# Patient Record
Sex: Female | Born: 1970 | Hispanic: Yes | Marital: Married | Smoking: Never smoker
Health system: Southern US, Community
[De-identification: ages and names within clinical notes are randomized; demographics above are authoritative.]

## PROBLEM LIST (undated history)

## (undated) HISTORY — PX: FRACTURE SURGERY: SHX138

---

## 2014-04-26 ENCOUNTER — Other Ambulatory Visit: Payer: Self-pay | Admitting: *Deleted

## 2014-04-26 DIAGNOSIS — N83209 Unspecified ovarian cyst, unspecified side: Secondary | ICD-10-CM

## 2014-04-27 ENCOUNTER — Telehealth: Payer: Self-pay | Admitting: *Deleted

## 2014-04-27 NOTE — Telephone Encounter (Signed)
Attempted to contact patient, no answer, left message for patient that she was referred to office and we scheduled her an ultrasound appointment for Monday, 9/28 @1245 .  Requested pt to call clinic to confirm she received the message.

## 2014-04-27 NOTE — Telephone Encounter (Signed)
Pt called the clinic to verify appointment.

## 2014-05-15 ENCOUNTER — Ambulatory Visit (HOSPITAL_COMMUNITY)
Admission: RE | Admit: 2014-05-15 | Discharge: 2014-05-15 | Disposition: A | Discharge status: Home / Self Care | Payer: Self-pay | Source: Ambulatory Visit | Attending: Obstetrics & Gynecology | Admitting: Obstetrics & Gynecology

## 2014-05-15 DIAGNOSIS — D259 Leiomyoma of uterus, unspecified: Secondary | ICD-10-CM | POA: Insufficient documentation

## 2014-05-15 DIAGNOSIS — Z30431 Encounter for routine checking of intrauterine contraceptive device: Secondary | ICD-10-CM | POA: Insufficient documentation

## 2014-05-15 DIAGNOSIS — N938 Other specified abnormal uterine and vaginal bleeding: Secondary | ICD-10-CM | POA: Insufficient documentation

## 2014-05-15 DIAGNOSIS — N83209 Unspecified ovarian cyst, unspecified side: Secondary | ICD-10-CM | POA: Insufficient documentation

## 2014-05-15 DIAGNOSIS — N949 Unspecified condition associated with female genital organs and menstrual cycle: Secondary | ICD-10-CM | POA: Insufficient documentation

## 2014-05-17 ENCOUNTER — Telehealth: Payer: Self-pay | Admitting: *Deleted

## 2014-05-17 NOTE — Telephone Encounter (Signed)
Pt returned our call and I spoke to her with interpreter Kimberlee Nearing. I informed her of [place] results as requested by Dr. Harolyn Rutherford and stated that she may have mild pain occasionally. I instructed her to come to hospital immediately if she develops severe abdominal/pelvic pain. I also advised her that she will need a follow up appt with the doctor to discuss plan for further management. She will receive a call within the next few days with her appt information.  Pt voiced understanding of all information and instructions given.

## 2014-05-17 NOTE — Telephone Encounter (Signed)
Called Dawn Hart with Interpreter Kimberlee Nearing and left message we are calling with some information, please call clinic.

## 2014-05-17 NOTE — Telephone Encounter (Signed)
Message copied by Samuel Germany on Wed May 17, 2014 11:05 AM ------      Message from: Verita Schneiders A      Created: Mon May 15, 2014  3:16 PM       Small fibroids and two left ovarian cysts.  IUD is in correct position.  No need for acute surgery, but give torsion precautions given size of left ovary.  Patient needs appointment to discuss results and further management.  Please call to inform patient of results and recommendations. ------

## 2014-06-19 ENCOUNTER — Ambulatory Visit (INDEPENDENT_AMBULATORY_CARE_PROVIDER_SITE_OTHER): Payer: Self-pay | Admitting: Obstetrics and Gynecology

## 2014-06-19 ENCOUNTER — Encounter: Payer: Self-pay | Admitting: Obstetrics and Gynecology

## 2014-06-19 VITALS — BP 109/50 | HR 64 | Temp 98.1°F | Wt 152.0 lb

## 2014-06-19 DIAGNOSIS — N8320 Unspecified ovarian cysts: Secondary | ICD-10-CM

## 2014-06-19 DIAGNOSIS — N83202 Unspecified ovarian cyst, left side: Secondary | ICD-10-CM

## 2014-06-19 NOTE — Progress Notes (Signed)
Lyla Son used as interpreter for this encounter.

## 2014-06-19 NOTE — Progress Notes (Signed)
Patient ID: Dawn Hart, female   DOB: [birth year], 43 y.o.   MRN: [id number] 43 yo G4P4 with LMP 06/12/2014 presenting today for the evaluation of an ovarian cyst. Patient reports being evaluated for an ovarian cyst since March 2015. She states she also has fibroids. Patient is here to discuss results of her ultrasound. Patient also reports a monthly period lasting 6 days with 4 days being very heavy. She describes the presence of a pelvic pain that seems to be worst in the evening. She describes it as menstrual cramps which are relieved by ibuprofen and tylenol  History reviewed. No pertinent past medical history. Past Surgical History  Procedure Laterality Date  . Fracture surgery      right elbow   Family History  Problem Relation Age of Onset  . Hypertension Mother   . Heart disease Mother    History  Substance Use Topics  . Smoking status: Never Smoker   . Smokeless tobacco: Never Used  . Alcohol Use: No   GENERAL: Well-developed, well-nourished female in no acute distress.  ABDOMEN: Soft, nontender, nondistended. No organomegaly. PELVIC: Normal external female genitalia. Uterus is normal in size. No adnexal mass or tenderness. EXTREMITIES: No cyanosis, clubbing, or edema, 2+ distal pulses.  05/15/2014 Ultrasound IMPRESSION: 1. Small uterine fibroids. 2. Intrauterine device in expected location. 3. Endometrial thickness is difficult to measure but appears normal. If bleeding remains unresponsive to hormonal or medical therapy, sonohysterogram should be considered for focal lesion work-up. (Ref: Radiological Reasoning: Algorithmic Workup of Abnormal Vaginal Bleeding with Endovaginal Sonography and Sonohysterography. AJR 2008; 709:U43-83) 4. Complex appearance of the left ovary. Neither mass demonstrates suspicious features but follow-up is warranted. Correlation with the prior study is recommended. Consider follow-up in 8-12 weeks if appearance is stable or  smaller compared with prior study.  A/P 43 yo here for the evaluation of an ovarian cyst - Ultrasound results reviewed and explained to the patient.  - Reassurance provided regarding size of fibroids - Discussed need to repeat imaging in early December to follow up on ovarian cyst. Patient states that she has had an ultrasound in March- will obtain records to review - Discussed changing IUD to [place] IUD as it is due to expire this year and it will decrease the flow of her cycles

## 2014-09-13 ENCOUNTER — Telehealth: Payer: Self-pay | Admitting: *Deleted

## 2014-09-13 DIAGNOSIS — N83202 Unspecified ovarian cyst, left side: Secondary | ICD-10-CM | POA: Insufficient documentation

## 2014-09-13 NOTE — Telephone Encounter (Addendum)
-----   Message from Mora Bellman, MD sent at 09/12/2014  9:03 AM EST ----- Please schedule follow up ultrasound for left ovarian cyst. Patient had her last ultrasound on 04/2014 and needed follow up in December/January.   Thanks  [employer] pt w/ [employer] [phone].  Message left that I am calling to schedule another [place] (order has been placed).  Please call back so that we may schedule this appt.  Lexiana Spindel RNC

## 2014-09-14 NOTE — Telephone Encounter (Signed)
Called Dawn Hart with Interpreter [employer]ter. Left message we are calling and need to talk to you, please call clinic before 4pm.

## 2014-09-20 NOTE — Telephone Encounter (Signed)
Called pt with interpreter Dawn Hart and informed her of need for repeat ultrasound to assess ovarian cyst. Pt stated that she had received our calls but had not called back because she is still working on her insurance. She has appt to settle that today.  She agreed to appt on 2/10 @ 1400.  Pt was informed she will need to have a full bladder just like at her [place] last September. Pt voiced understanding of all information.

## 2014-09-27 ENCOUNTER — Ambulatory Visit (HOSPITAL_COMMUNITY)
Admission: RE | Admit: 2014-09-27 | Discharge: 2014-09-27 | Disposition: A | Discharge status: Home / Self Care | Payer: No Typology Code available for payment source | Source: Ambulatory Visit | Attending: Obstetrics and Gynecology | Admitting: Obstetrics and Gynecology

## 2014-09-27 DIAGNOSIS — N8320 Unspecified ovarian cysts: Secondary | ICD-10-CM | POA: Insufficient documentation

## 2014-09-27 DIAGNOSIS — D252 Subserosal leiomyoma of uterus: Secondary | ICD-10-CM | POA: Insufficient documentation

## 2014-09-27 DIAGNOSIS — N83202 Unspecified ovarian cyst, left side: Secondary | ICD-10-CM

## 2014-09-27 DIAGNOSIS — D250 Submucous leiomyoma of uterus: Secondary | ICD-10-CM | POA: Insufficient documentation

## 2014-09-27 DIAGNOSIS — N7011 Chronic salpingitis: Secondary | ICD-10-CM | POA: Insufficient documentation

## 2014-10-03 ENCOUNTER — Telehealth: Payer: Self-pay

## 2014-10-03 NOTE — Telephone Encounter (Signed)
-----   Message from Mora Bellman, MD sent at 09/29/2014 10:52 AM EST ----- Please inform patient of complete resolution of the left ovarian cyst  Thanks  Vickii Chafe

## 2014-10-03 NOTE — Telephone Encounter (Signed)
Called patient with pacific interpreter [phone]. No answer. Left message stating we are calling with results, please call clinic.

## 2014-10-05 NOTE — Telephone Encounter (Signed)
Called pt with Spanish interpreter Dawn Hart and LM to call the clinics it is concerning results.  Letter sent.

## 2015-04-10 IMAGING — US US TRANSVAGINAL NON-OB
1 series · 13 of 25 positions shown · non-contrast
Comparison: None

CLINICAL DATA: Previous cyst of the left ovary. Pelvic pain and
bleeding over the last 8 months. Intrauterine device. 05/08/2014
gravida 4 para 4.



[Series 1: us transvaginal non-ob · 0.20mm/px · 13 of 72 slices shown]
[im 1/72]
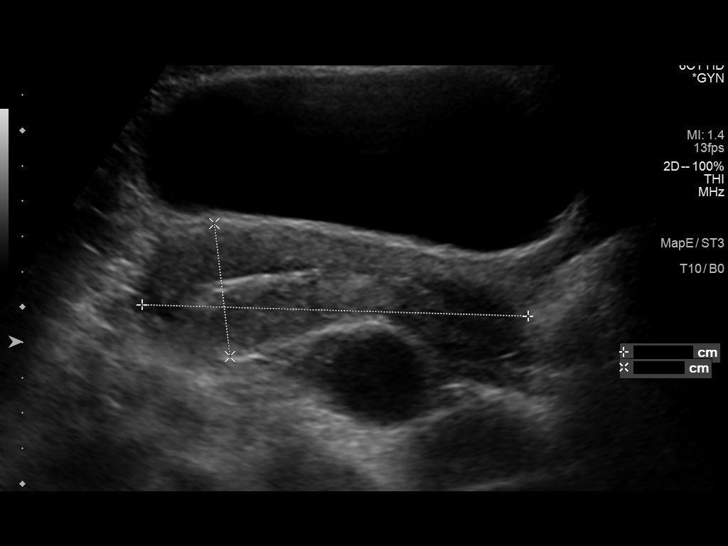
[im 6/72]
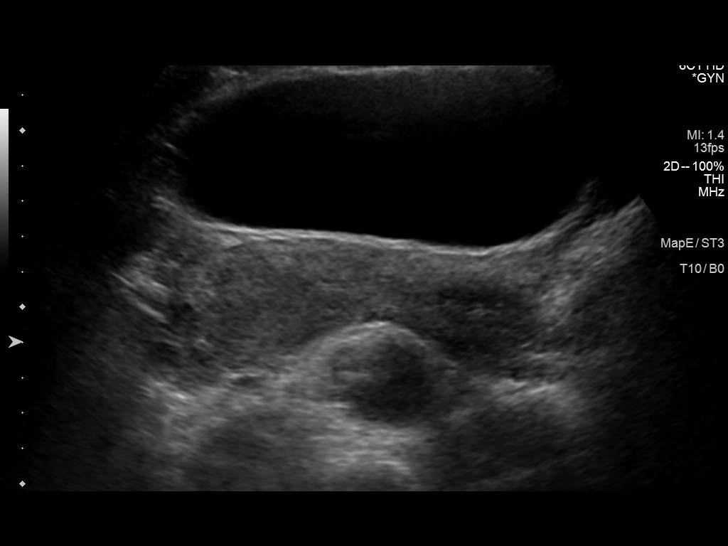
[im 12/72]
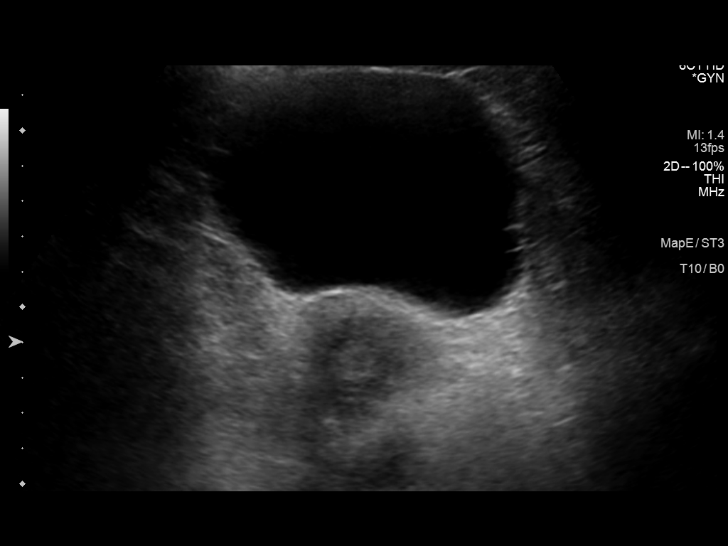
[im 18/72]
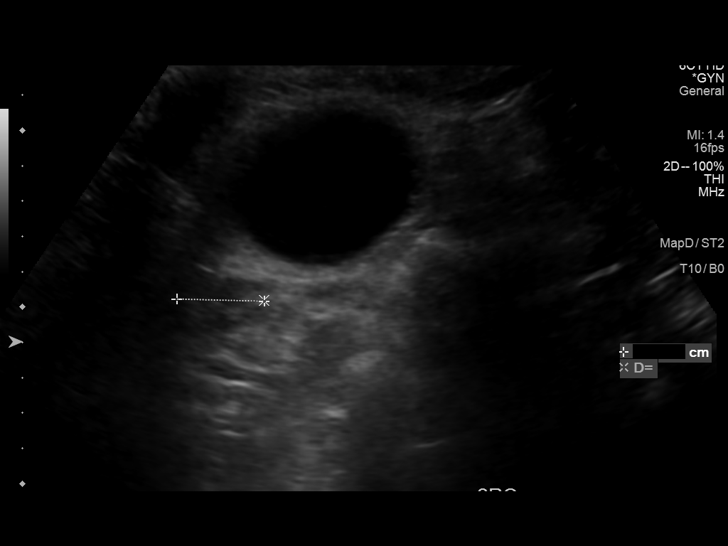
[im 24/72]
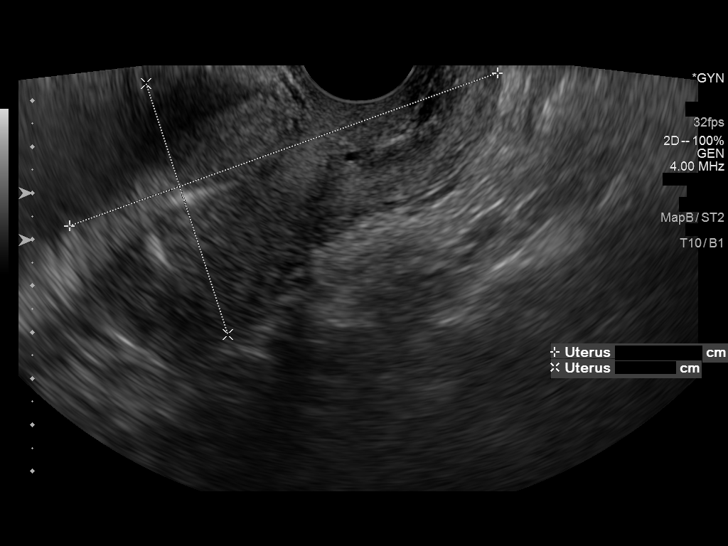
[im 30/72]
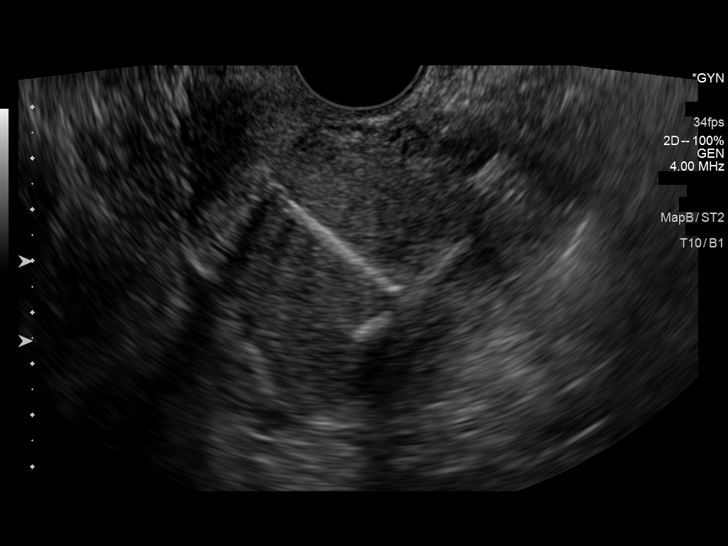
[im 36/72]
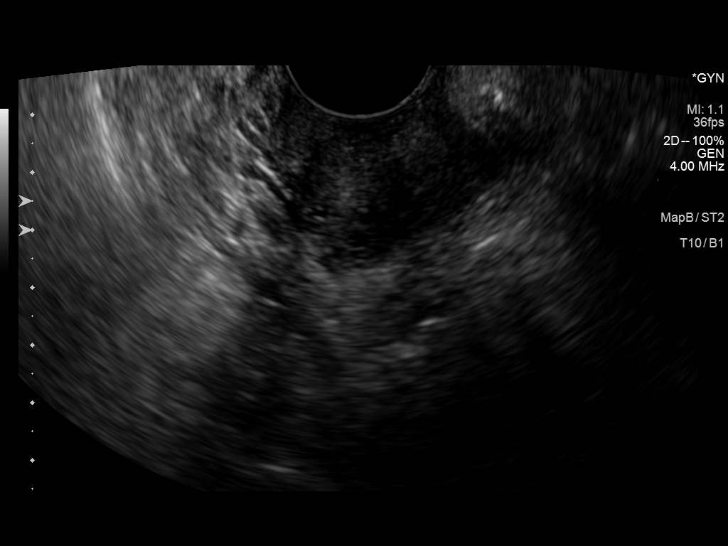
[im 42/72]
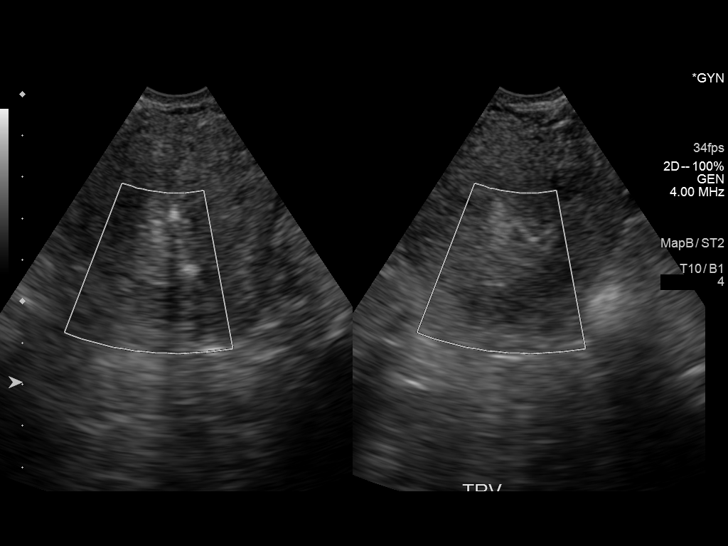
[im 48/72]
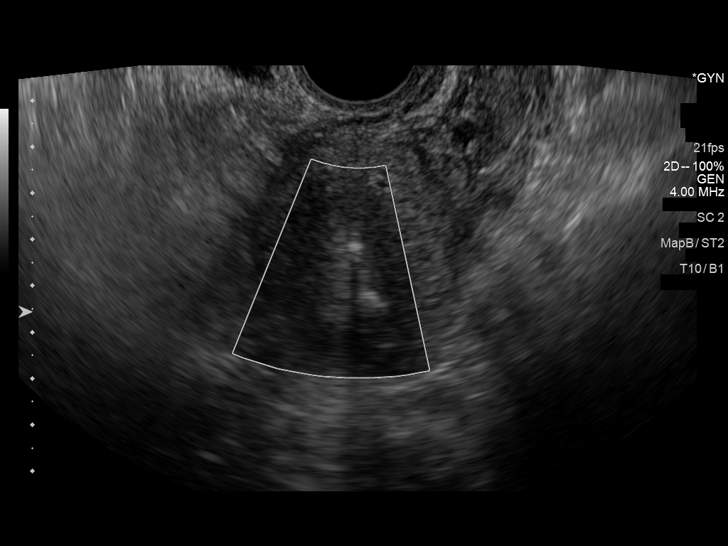
[im 54/72]
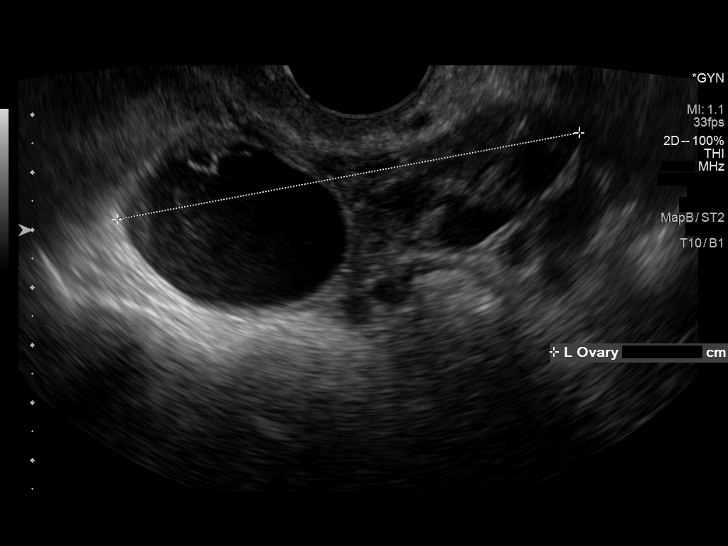
[im 60/72]
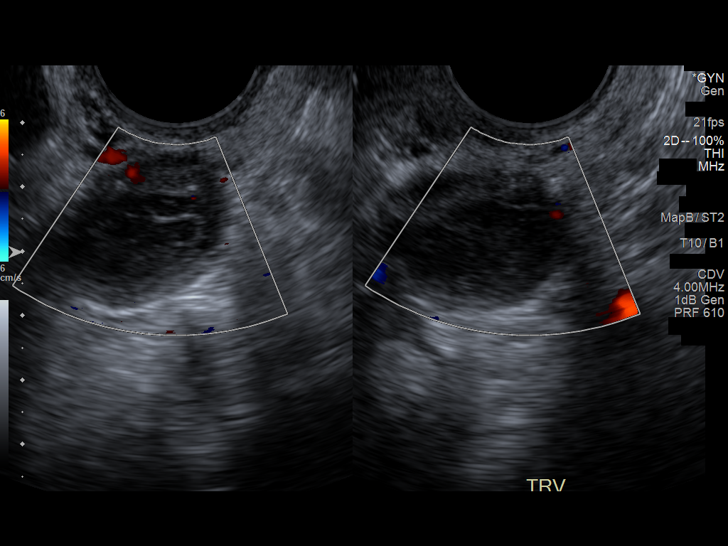
[im 66/72]
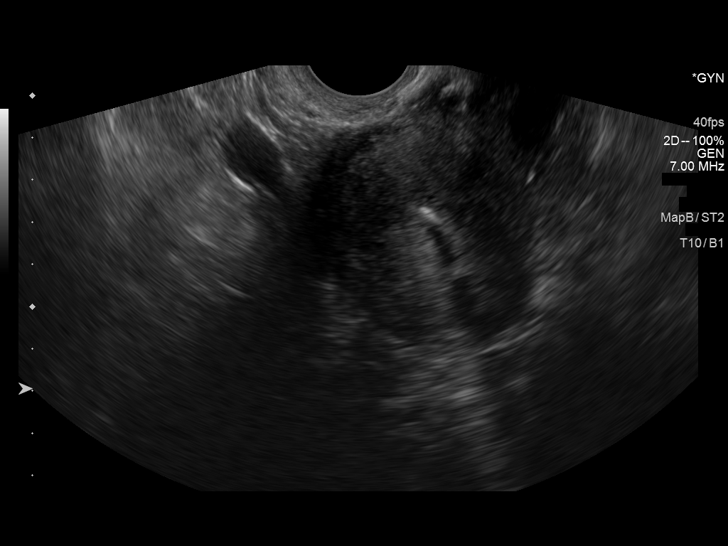
[im 72/72]
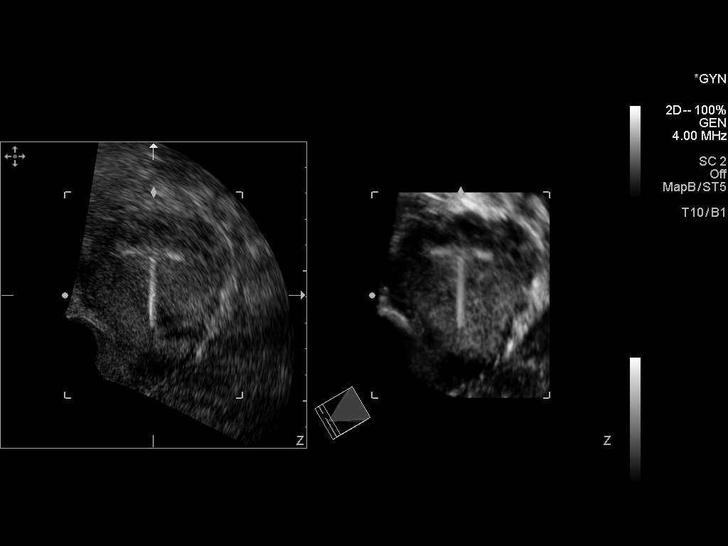

[13 of 25 positions shown; findings below may reference images not displayed]

FINDINGS: Uterus

Measurements: 10.9 x 3.8 x 5.4 cm. Small myometrial fibroids are
identified. Left mid uterine fibroid is 1.2 x 0.9 x 1.1 cm. Left
fundal fibroid is 0.8 x 0.6 x 0.8 cm. Right mid uterine fibroid is
0.9 x 0.8 x 0.9 cm.

Endometrium

Thickness: 1.2 cm ; somewhat difficult to measure given the position
of the uterus. Intrauterine device is identified in expected
location.

Right ovary

Measurements: 2.2 x 1.7 x 2.5 cm. Normal appearance/no adnexal mass.

Left ovary

Measurements: 6.4 x 3.2 x 8.2 cm. There are 2 discrete masses within
the left ovary. The first is more anechoic and contains thin
internal septations. Cyst measures 3.9 x 3.0 x 3.7 cm. The second
mass contains low-level reticular echoes and measures 2.5 x 1.7 x
2.7 cm. Neither mass demonstrates internal blood flow. There is
color Doppler flow within the ovary.

Other findings

No free fluid.
IMPRESSION: 1. Small uterine fibroids.
2. Intrauterine device in expected location.
3. Endometrial thickness is difficult to measure but appears normal.
If bleeding remains unresponsive to hormonal or medical therapy,
sonohysterogram should be considered for focal lesion work-up. (Ref:
Radiological Reasoning: Algorithmic Workup of Abnormal Vaginal
Bleeding with Endovaginal Sonography and Sonohysterography. AJR
5337; 191:S68-73)
4. Complex appearance of the left ovary. Neither mass demonstrates
suspicious features but follow-up is warranted. Correlation with the
prior study is recommended. Consider follow-up in 8-12 weeks if
appearance is stable or smaller compared with prior study.

## 2015-08-23 IMAGING — US US PELVIS COMPLETE
1 series · 14 of 25 positions shown · non-contrast
Comparison: None

CLINICAL DATA: Follow-up left ovarian cyst, known fibroids, Mirena
IUD

EXAM:
TRANSABDOMINAL AND TRANSVAGINAL ULTRASOUND OF PELVIS
TECHNIQUE: Both transabdominal and transvaginal ultrasound examinations of the
pelvis were performed. Transabdominal technique was performed for
global imaging of the pelvis including uterus, ovaries, adnexal
regions, and pelvic cul-de-sac. It was necessary to proceed with
endovaginal exam following the transabdominal exam to visualize the
endometrium and right ovary.

[Series 1: us pelvis complete · 14 of 57 slices shown]
[im 1/57]
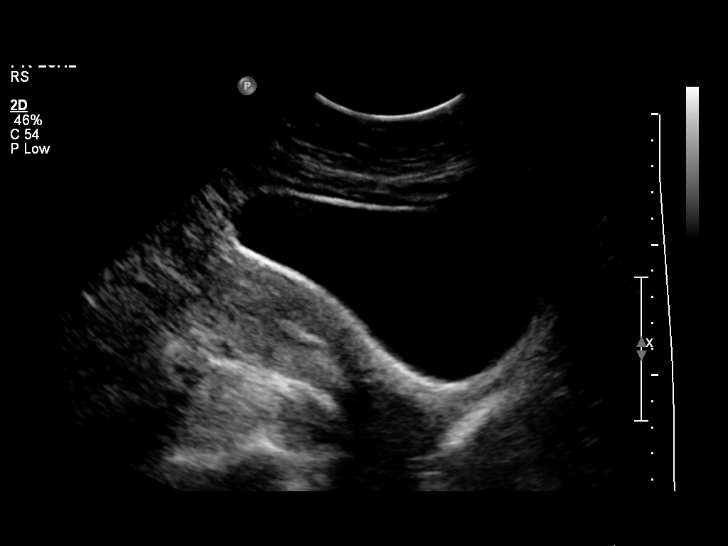
[im 5/57]
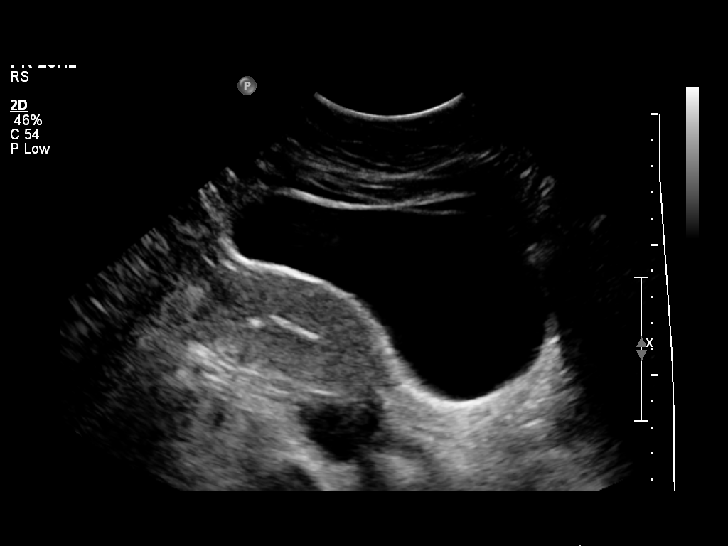
[im 10/57]
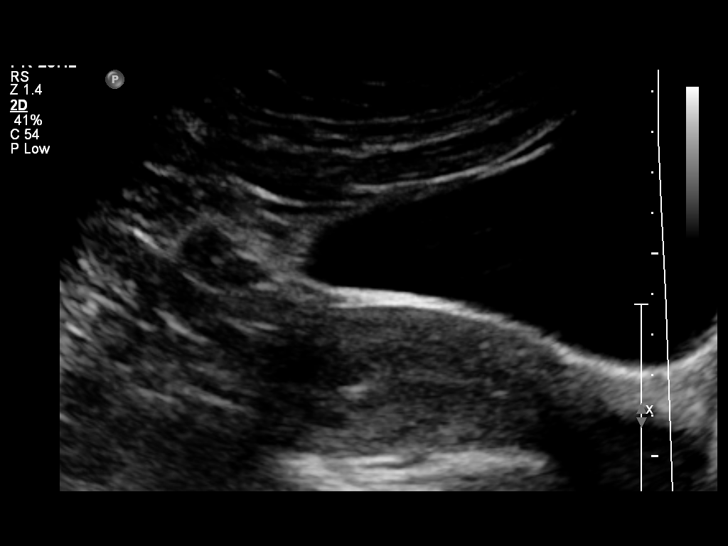
[im 15/57]
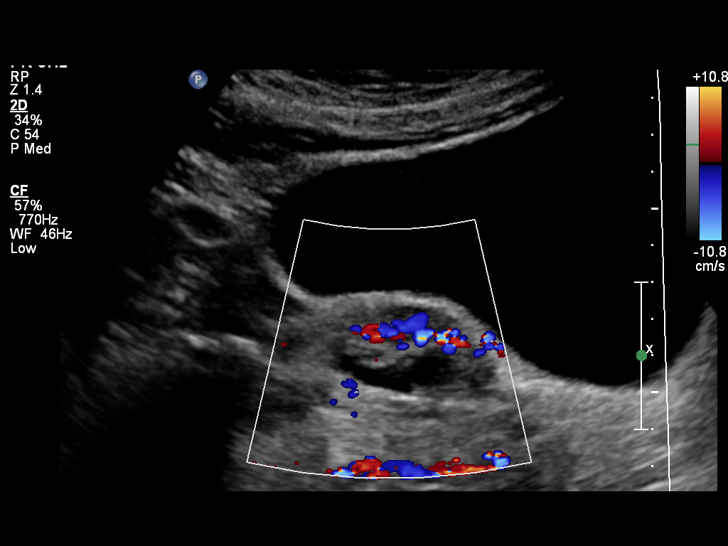
[im 19/57]
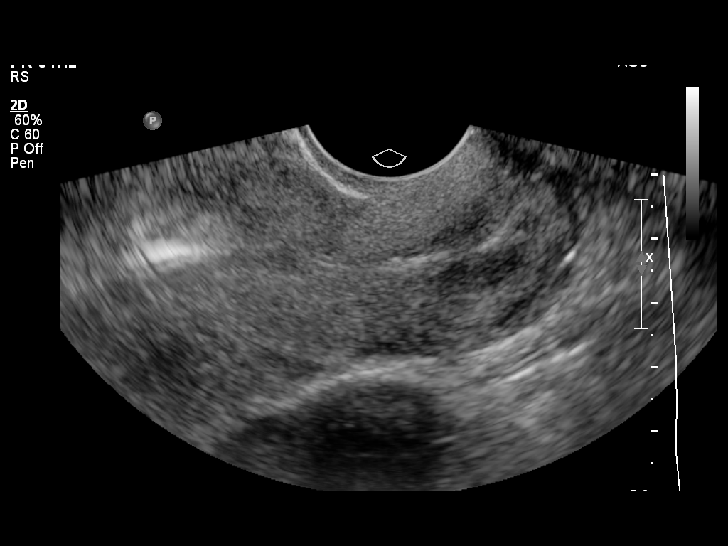
[im 22/57]
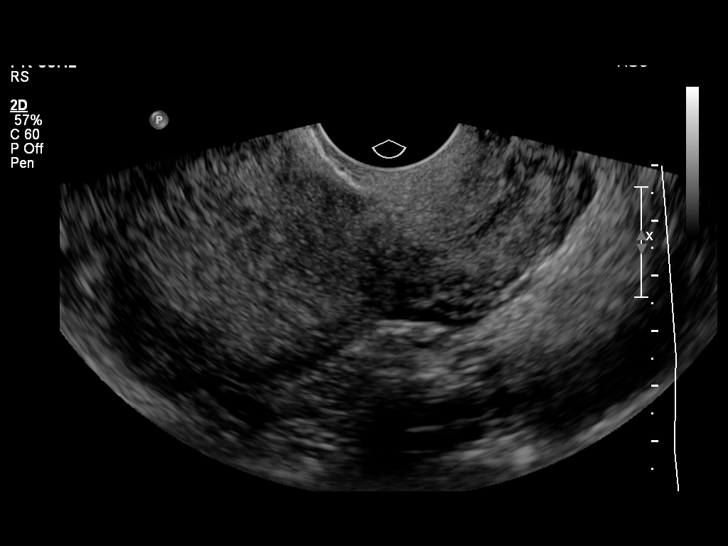
[im 26/57]
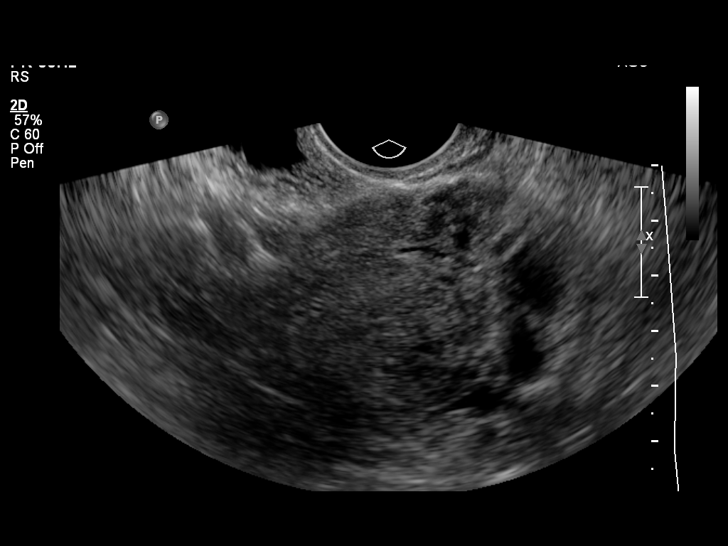
[im 31/57]
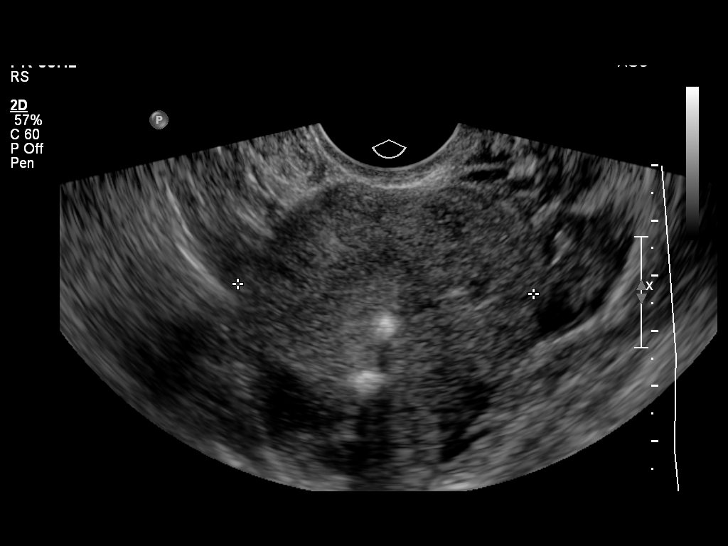
[im 36/57]
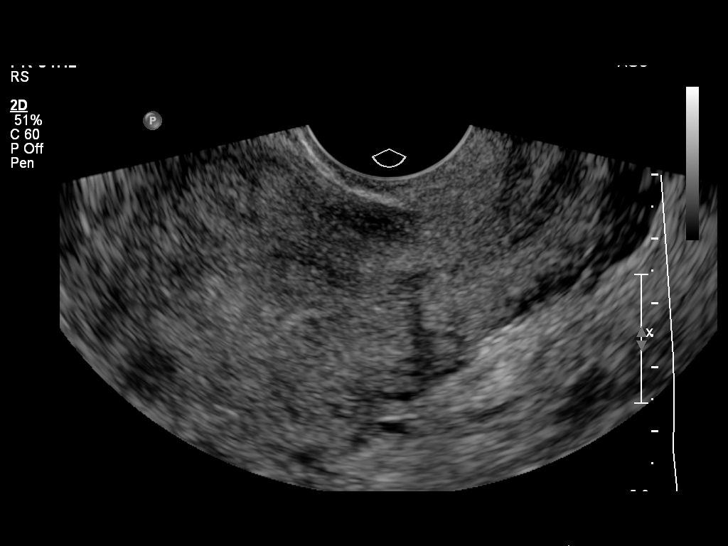
[im 38/57]
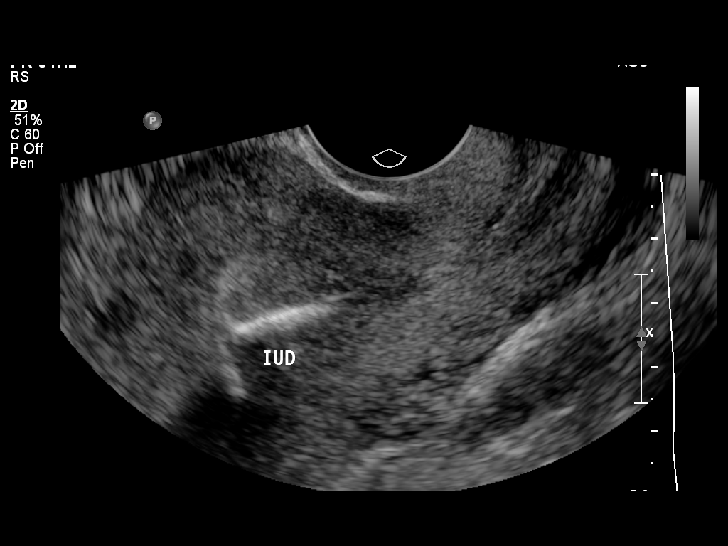
[im 43/57]
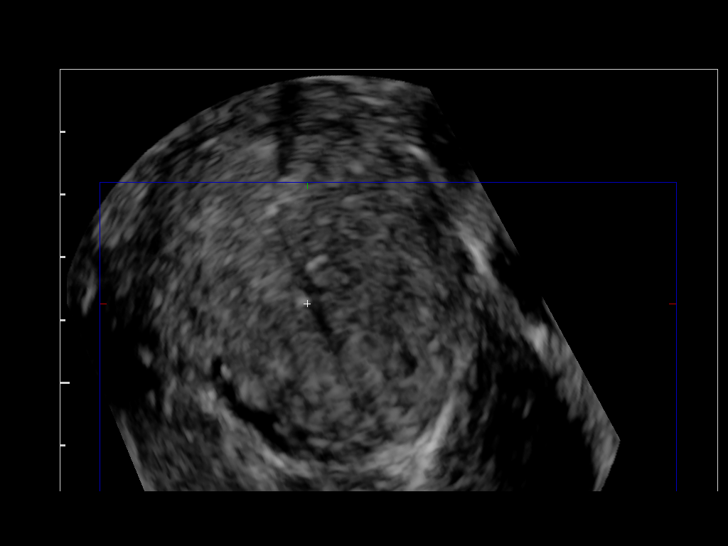
[im 47/57]
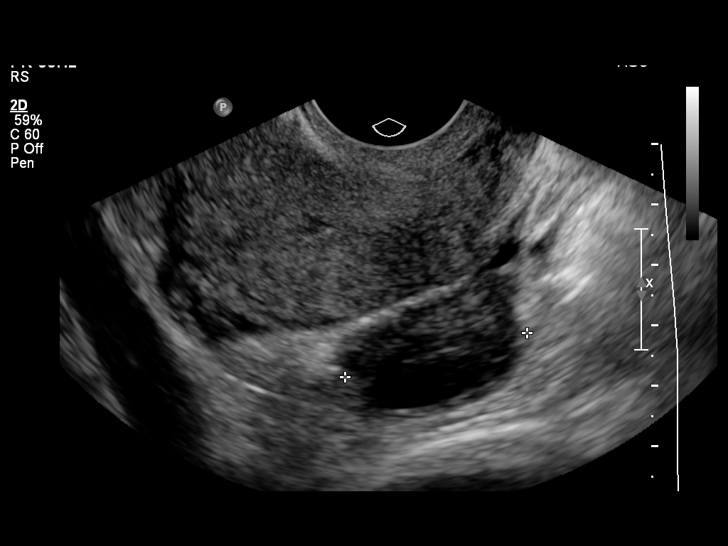
[im 52/57]
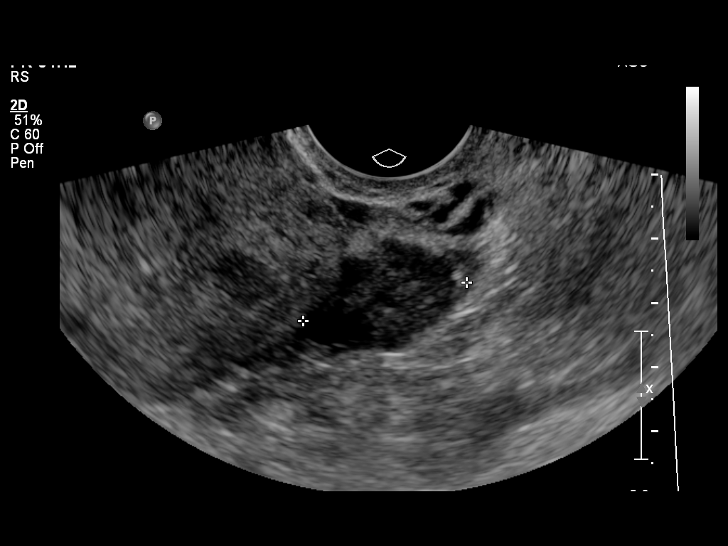
[im 57/57]
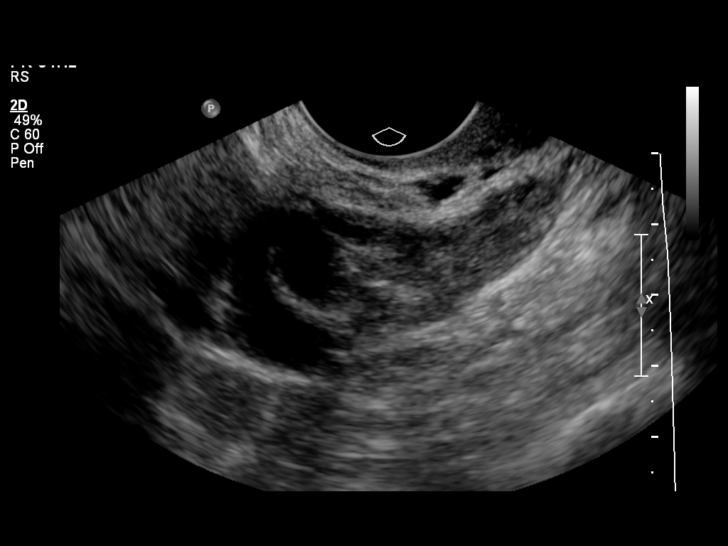

[14 of 25 positions shown; findings below may reference images not displayed]

FINDINGS: Uterus

Measurements: 8.8 x 4.6 x 5.4 cm. 1.3 x 0.9 x 1.0 cm subserosal
fibroid in the posterior uterine fundus. 1.2 x 0.7 x 1.1 cm
submucosal fibroid in the posterior uterine body.

Endometrium

Thickness: 4 mm.  IUD in satisfactory position.

Right ovary

Measurements: 4.2 x 1.9 x 3.1 cm. Normal appearance/no adnexal mass.

Left ovary

Measurements: 3.9 x 2.0 x 2.6 cm. Left hydrosalpinx.

Other findings

No free fluid.
IMPRESSION: Two small uterine fibroids measuring up to 1.3 cm, as above.

IUD in satisfactory position.

Left hydrosalpinx.
